# Patient Record
Sex: Female | Born: 1975 | Race: Black or African American | Hispanic: No | Marital: Married | Smoking: Never smoker
Health system: Southern US, Community
[De-identification: ages and names within clinical notes are randomized; demographics above are authoritative.]

## PROBLEM LIST (undated history)

## (undated) DIAGNOSIS — I10 Essential (primary) hypertension: Secondary | ICD-10-CM

## (undated) HISTORY — PX: ABDOMINAL HYSTERECTOMY: SHX81

---

## 2004-05-27 ENCOUNTER — Ambulatory Visit: Payer: Self-pay | Admitting: Family Medicine

## 2004-05-31 ENCOUNTER — Ambulatory Visit (HOSPITAL_COMMUNITY): Admission: RE | Admit: 2004-05-31 | Discharge: 2004-05-31 | Payer: Self-pay | Admitting: Family Medicine

## 2004-06-03 ENCOUNTER — Ambulatory Visit: Payer: Self-pay | Admitting: Family Medicine

## 2005-11-10 IMAGING — US US PELVIS COMPLETE MODIFY
1 series · 14 of 25 positions shown · non-contrast
Comparison: none

CLINICAL DATA: Dysmenorrhea.
 PELVIC ULTRASOUND COMPLETE MODIFIED AND TRANSVAGINAL NON-OBSTETRICAL ULTRASOUND OF THE PELVIS:
 Multiple scans of the pelvis show the uterus to be slightly enlarged at 5.0 x 5.0 x 10.2 cm.  The endometrium is slightly cystic and in the mid endometrial area shows a possible very small polyp.  The endometrium measures 13 mm in maximum thickness.  
 The right ovary is normal and measures 3.5 x 1.9 x 4.3 cm and the left ovary is normal and measures 4.2 x 2.5 x 4.0 cm.  There is a small amount of free fluid within the pelvis.

[Series 1: unknown · 0.32mm/px · 14 of 49 slices shown]
[im 1/49]
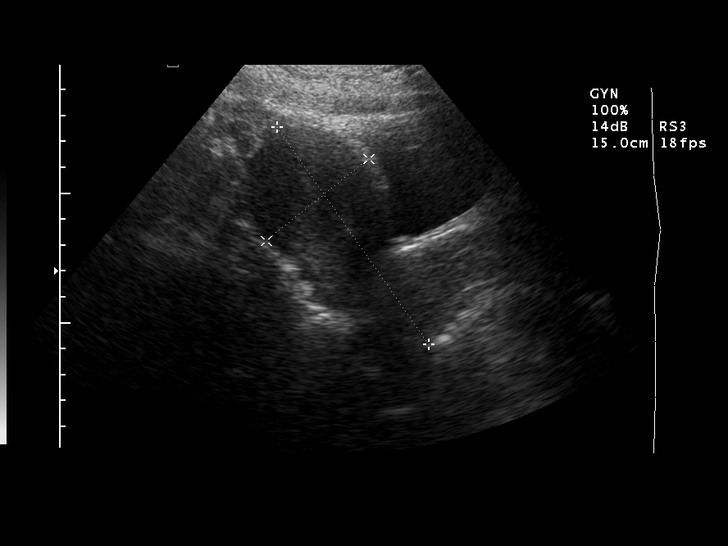
[im 5/49]
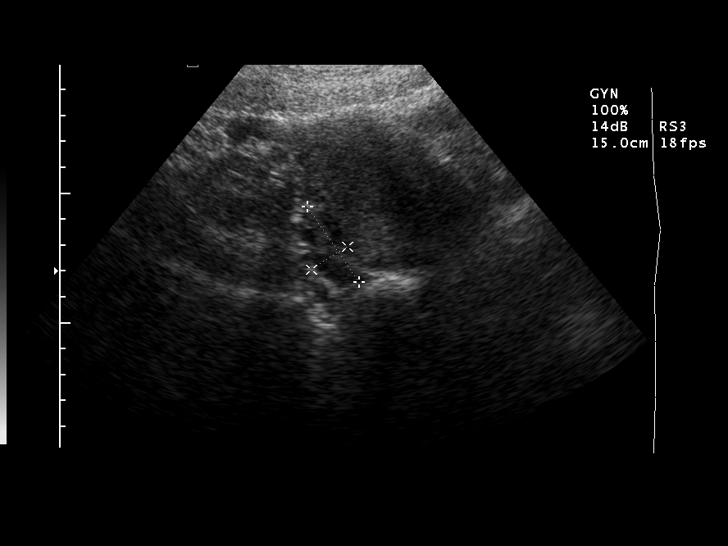
[im 9/49]
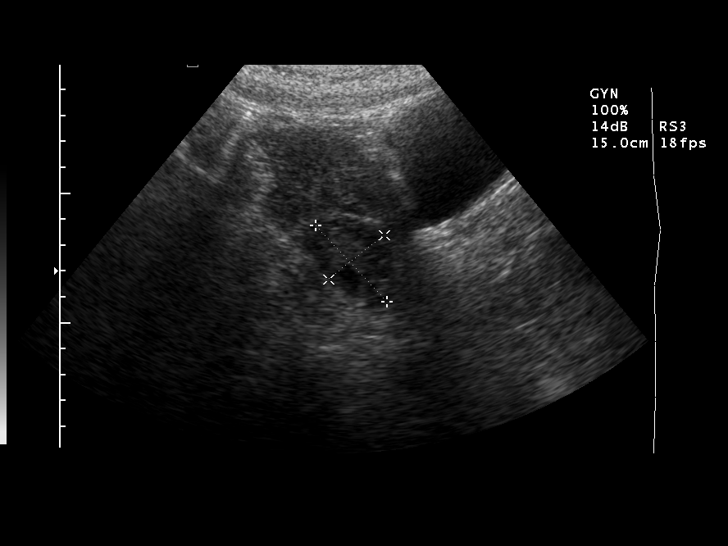
[im 13/49]
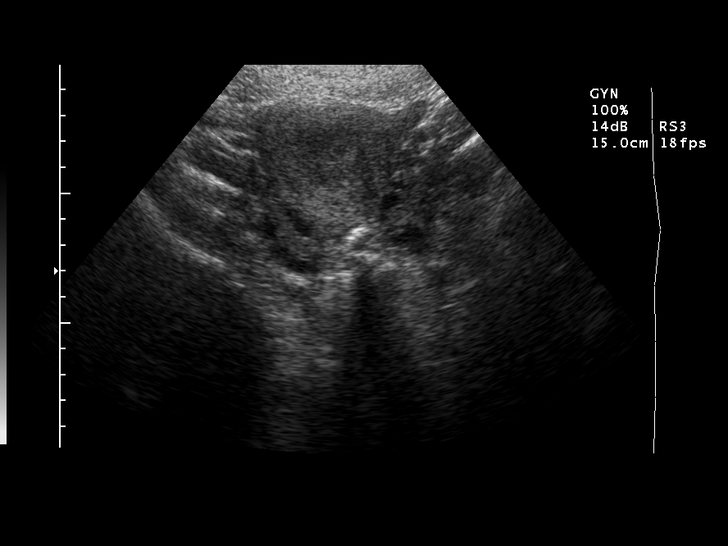
[im 17/49]
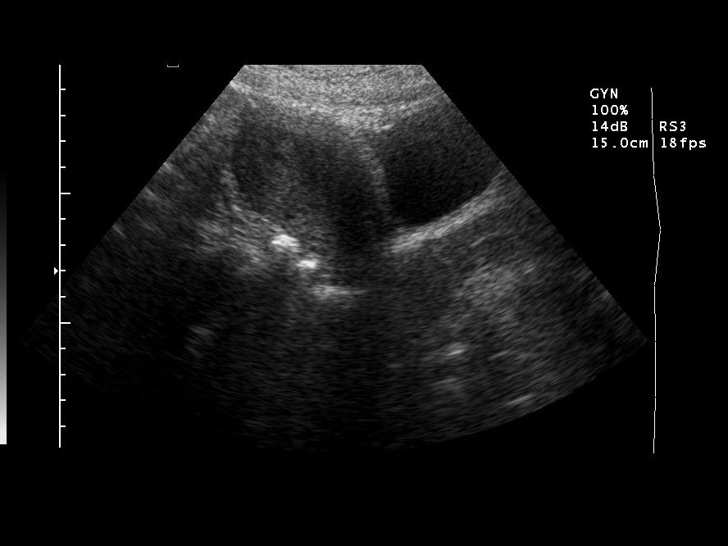
[im 19/49]
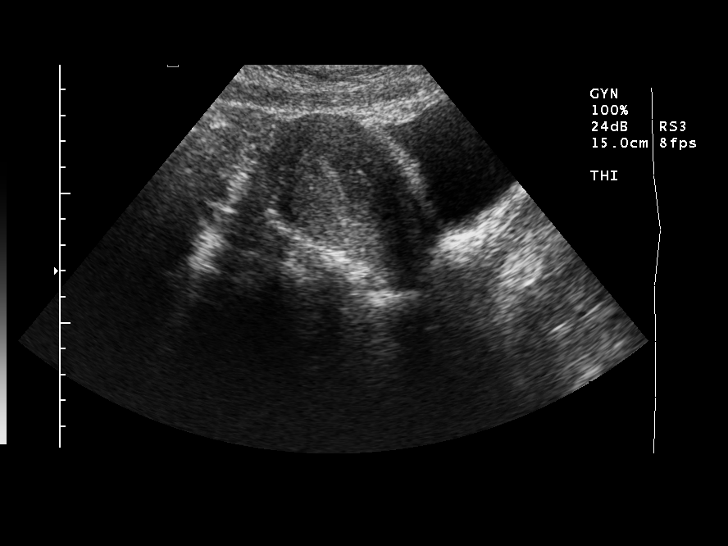
[im 23/49]
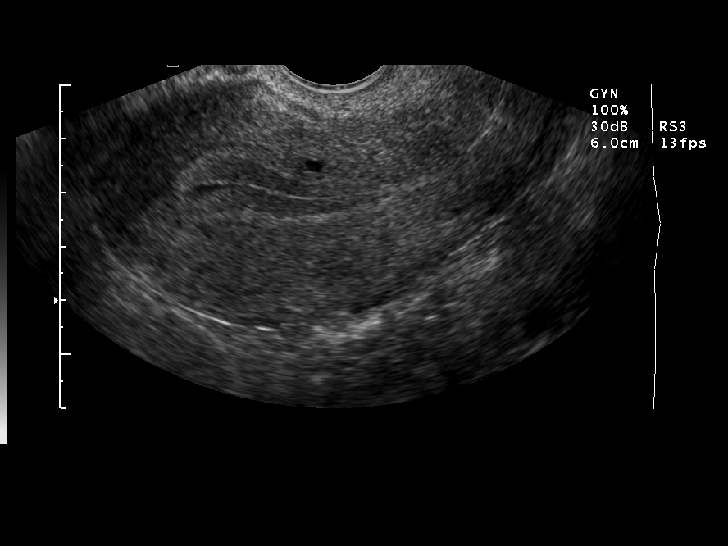
[im 27/49]
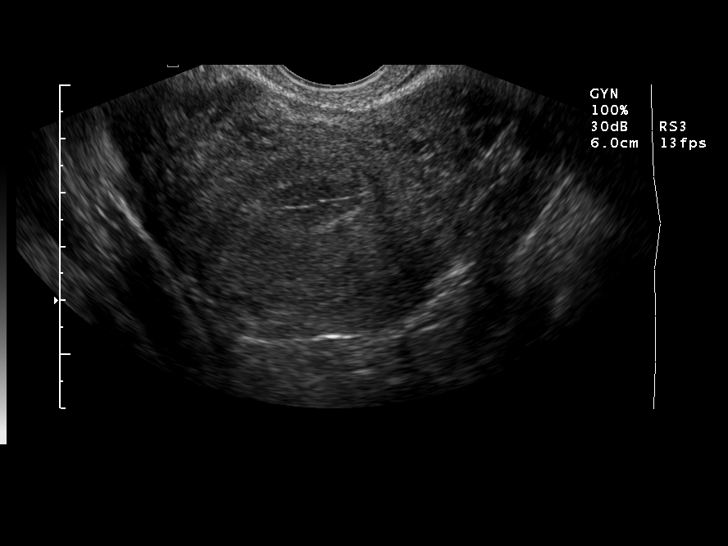
[im 31/49]
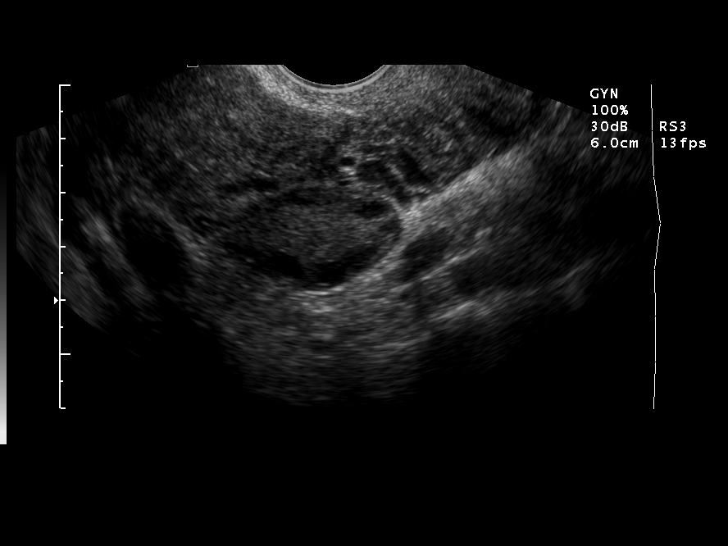
[im 33/49]
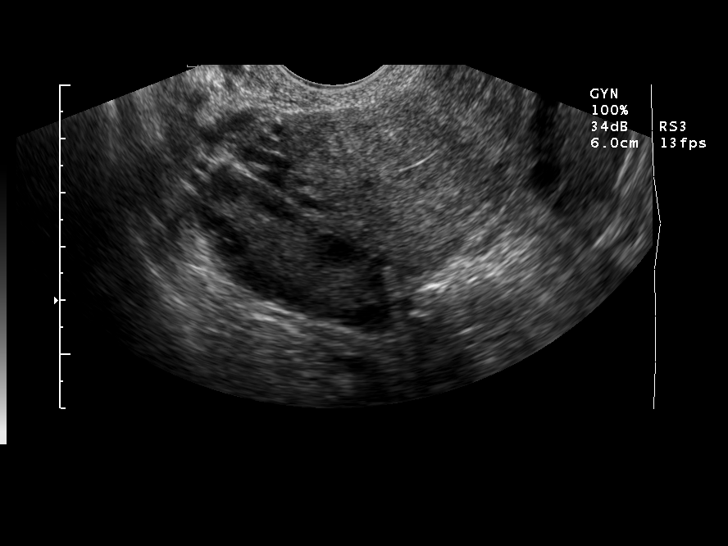
[im 37/49]
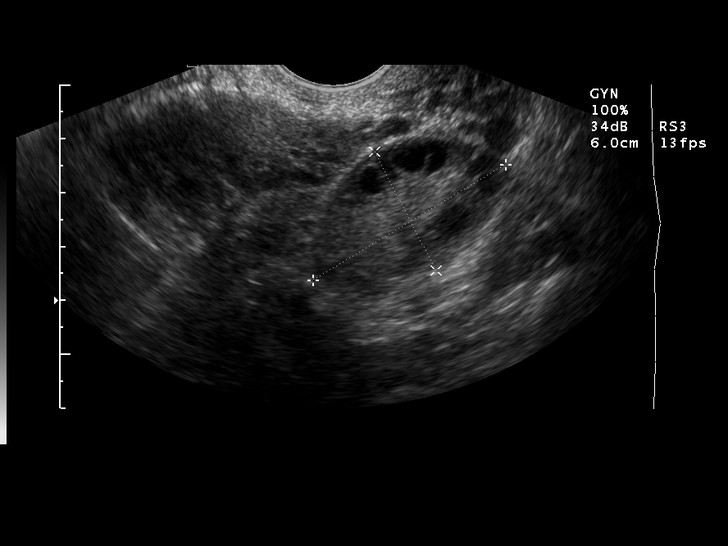
[im 41/49]
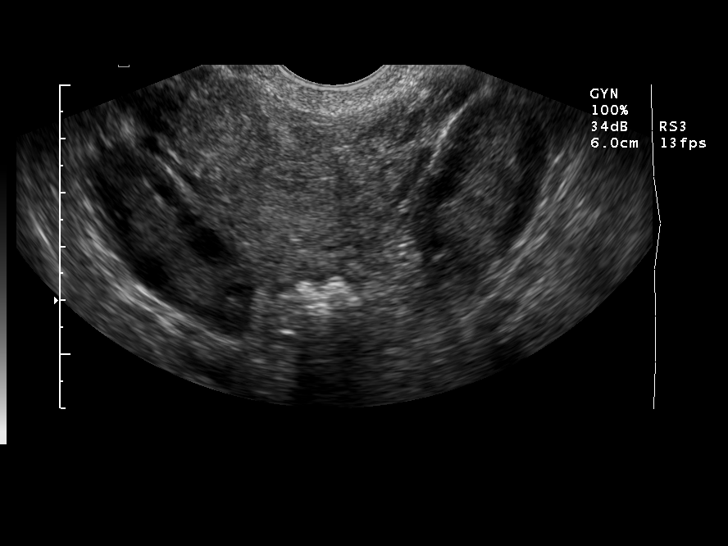
[im 45/49]
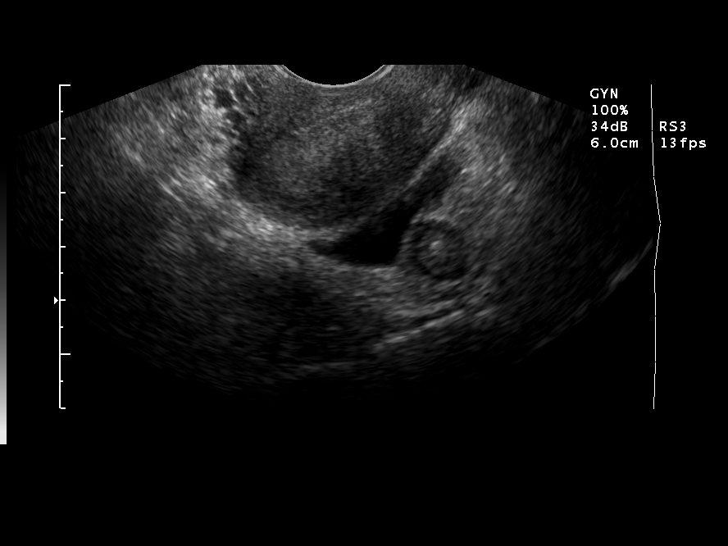
[im 49/49]
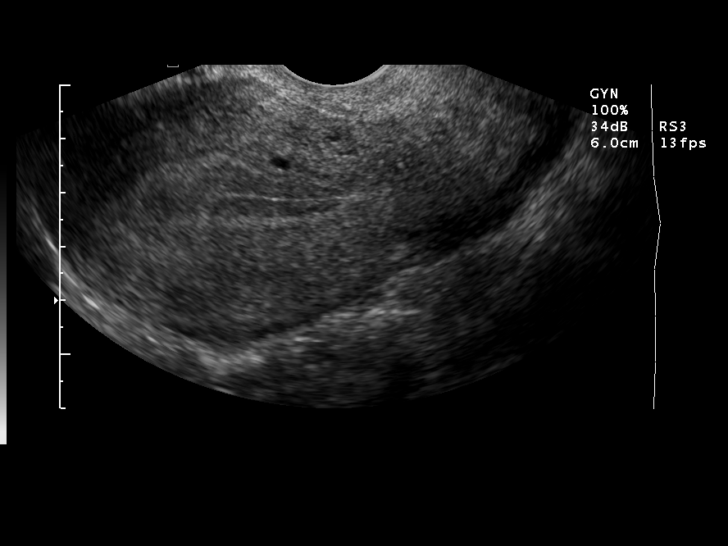

[14 of 25 positions shown; findings below may reference images not displayed]

IMPRESSION: The uterus is slightly prominent in size but shows no focal masses.  The endometrium is somewhat prominent at 13 mm and shows a cystic area in the mid endometrial region.  I cannot rule out a small polyp in that area.  The ovaries are normal.  There is a small amount of free fluid in the pelvis.

## 2011-12-11 ENCOUNTER — Ambulatory Visit: Payer: Self-pay | Attending: Family Medicine | Admitting: Physical Therapy

## 2011-12-11 DIAGNOSIS — R5381 Other malaise: Secondary | ICD-10-CM | POA: Insufficient documentation

## 2011-12-11 DIAGNOSIS — M542 Cervicalgia: Secondary | ICD-10-CM | POA: Insufficient documentation

## 2011-12-11 DIAGNOSIS — IMO0001 Reserved for inherently not codable concepts without codable children: Secondary | ICD-10-CM | POA: Insufficient documentation

## 2011-12-15 ENCOUNTER — Ambulatory Visit: Payer: Self-pay | Admitting: Physical Therapy

## 2011-12-18 ENCOUNTER — Ambulatory Visit: Payer: Self-pay | Admitting: Physical Therapy

## 2011-12-19 ENCOUNTER — Encounter: Payer: Self-pay | Admitting: Physical Therapy

## 2011-12-22 ENCOUNTER — Ambulatory Visit: Payer: Self-pay | Admitting: Physical Therapy

## 2011-12-25 ENCOUNTER — Encounter: Payer: Self-pay | Admitting: Physical Therapy

## 2011-12-30 ENCOUNTER — Ambulatory Visit: Payer: Self-pay | Admitting: Physical Therapy

## 2012-01-08 ENCOUNTER — Ambulatory Visit: Payer: Self-pay | Admitting: Physical Therapy

## 2018-11-19 ENCOUNTER — Other Ambulatory Visit: Payer: Self-pay

## 2018-11-19 ENCOUNTER — Emergency Department (INDEPENDENT_AMBULATORY_CARE_PROVIDER_SITE_OTHER): Payer: Self-pay

## 2018-11-19 ENCOUNTER — Emergency Department (INDEPENDENT_AMBULATORY_CARE_PROVIDER_SITE_OTHER)
Admission: EM | Admit: 2018-11-19 | Discharge: 2018-11-19 | Disposition: A | Discharge status: Home / Self Care | Payer: Self-pay | Source: Home / Self Care | Attending: Family Medicine | Admitting: Family Medicine

## 2018-11-19 DIAGNOSIS — S43402A Unspecified sprain of left shoulder joint, initial encounter: Secondary | ICD-10-CM

## 2018-11-19 DIAGNOSIS — M25512 Pain in left shoulder: Secondary | ICD-10-CM

## 2018-11-19 HISTORY — DX: Essential (primary) hypertension: I10
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
## 2018-11-19 NOTE — Discharge Instructions (Addendum)
Apply ice pack for 20 to 30 minutes, 3 to 4 times daily  Continue until pain and swelling decrease.  May take Ibuprofen 200mg , 4 tabs every 8 hours with food as needed for pain. Begin range of motion and stretching exercises as tolerated.

## 2019-04-10 ENCOUNTER — Emergency Department (INDEPENDENT_AMBULATORY_CARE_PROVIDER_SITE_OTHER)
Admission: EM | Admit: 2019-04-10 | Discharge: 2019-04-10 | Disposition: A | Discharge status: Home / Self Care | Payer: BC Managed Care – PPO | Source: Home / Self Care | Attending: Family Medicine | Admitting: Family Medicine

## 2019-04-10 ENCOUNTER — Encounter: Payer: Self-pay | Admitting: Emergency Medicine

## 2019-04-10 ENCOUNTER — Other Ambulatory Visit: Payer: Self-pay

## 2019-04-10 DIAGNOSIS — J069 Acute upper respiratory infection, unspecified: Secondary | ICD-10-CM | POA: Diagnosis not present

## 2019-04-10 DIAGNOSIS — J0101 Acute recurrent maxillary sinusitis: Secondary | ICD-10-CM

## 2019-04-10 MED ORDER — METHYLPREDNISOLONE ACETATE 80 MG/ML IJ SUSP
80.0000 mg | Freq: Once | INTRAMUSCULAR | Status: AC
  Administered 2019-04-10: 16:00:00 80 mg via INTRAMUSCULAR

## 2019-04-10 MED ORDER — AZITHROMYCIN 250 MG PO TABS: ORAL_TABLET | ORAL | 0 refills | Status: DC

## 2019-04-10 NOTE — ED Provider Notes (Signed)
[name]    CSN: [id number] Arrival date & time: 04/10/19  1359      History   Chief Complaint Chief Complaint  Patient presents with  . Nasal Congestion    HPI Marissa Sweeney is a 44 y.o. female.   Patient complains of four day history of mild sore throat, sinus congestion, fatigue, facial pressure, and mild cough.  She denies fever. She has a history of perennial rhinitis and recurring sinusitis.  The history is provided by the patient.    Past Medical History:  Diagnosis Date  . Hypertension     There are no problems to display for this patient.   Past Surgical History:  Procedure Laterality Date  . ABDOMINAL HYSTERECTOMY      OB History   No obstetric history on file.      Home Medications    Prior to Admission medications   Medication Sig Start Date End Date Taking? Authorizing Provider  cetirizine (ZYRTEC) 10 MG tablet Take 10 mg by mouth daily.   Yes [provider]  azithromycin (ZITHROMAX Z-PAK) 250 MG tablet Take 2 tabs today; then begin one tab once daily for 4 more days. 04/10/19   Kandra Nicolas, MD  Olmesartan-amLODIPine-HCTZ 40-10-25 MG TABS Take 1 tablet by mouth once daily 07/25/18   [provider]    Family History No family history on file.  Social History Social History   Tobacco Use  . Smoking status: Never Smoker  . Smokeless tobacco: Never Used  Substance Use Topics  . Alcohol use: Yes    Comment: rarely  . Drug use: Not Currently     Allergies   Patient has no known allergies.   Review of Systems Review of Systems ? sore throat + cough No pleuritic pain No wheezing + nasal congestion + post-nasal drainage + sinus pain/pressure No itchy/red eyes No earache No hemoptysis No SOB No fever/chills No nausea No vomiting No abdominal pain No diarrhea No urinary symptoms No skin rash + fatigue No myalgias No headache   Physical Exam Triage Vital Signs ED Triage  Vitals  Enc Vitals Group     BP 04/10/19 1451 138/79     Pulse Rate 04/10/19 1451 78     Resp 04/10/19 1451 16     Temp 04/10/19 1451 98.6 F (37 C)     Temp Source 04/10/19 1451 Oral     SpO2 04/10/19 1451 97 %     Weight 04/10/19 1452 188 lb (85.3 kg)     Height 04/10/19 1452 5\' 2"  (1.575 m)     Head Circumference --      Peak Flow --      Pain Score 04/10/19 1452 1     Pain Loc --      Pain Edu? --      Excl. in [place]? --    No data found.  Updated Vital Signs BP 138/79 (BP Location: Right Arm)   Pulse 78   Temp 98.6 F (37 C) (Oral)   Resp 16   Ht 5\' 2"  (1.575 m)   Wt 85.3 kg   SpO2 97%   BMI 34.39 kg/m   Visual Acuity Right Eye Distance:   Left Eye Distance:   Bilateral Distance:    Right Eye Near:   Left Eye Near:    Bilateral Near:     Physical Exam Nursing notes and Vital Signs reviewed. Appearance:  Patient appears stated age, and in no acute distress  Eyes:  Pupils are equal, round, and reactive to light and accomodation.  Extraocular movement is intact.  Conjunctivae are not inflamed  Ears:  Canals normal.  Tympanic membranes normal.  Nose:  Congested turbinates.  Maxillary sinus tenderness is present.  Pharynx:  Normal Neck:  Supple. No adenopathy. Lungs:  Clear to auscultation.  Breath sounds are equal.  Moving air well. Heart:  Regular rate and rhythm without murmurs, rubs, or gallops.  Abdomen:  Nontender without masses or hepatosplenomegaly.  Bowel sounds are present.  No CVA or flank tenderness.  Extremities:  No edema.  Skin:  No rash present.   UC Treatments / Results  Labs (all labs ordered are listed, but only abnormal results are displayed) Labs Reviewed - No data to display  EKG   Radiology No results found.  Procedures Procedures (including critical care time)  Medications Ordered in UC Medications  methylPREDNISolone acetate (DEPO-MEDROL) injection 80 mg (has no administration in time range)    Initial Impression /  Assessment and Plan / UC Course  I have reviewed the triage vital signs and the nursing notes.  Pertinent labs & imaging results that were available during my care of the patient were reviewed by me and considered in my medical decision making (see chart for details).    Administered Depo Medrol 80mg  IM. Begin Z-pak. Followup with Family Doctor if not improved in about 10 days.   Final Clinical Impressions(s) / UC Diagnoses   Final diagnoses:  Viral URI with cough  Acute recurrent maxillary sinusitis     Discharge Instructions     Take plain guaifenesin (1200mg  extended release tabs such as Mucinex) twice daily, with plenty of water, for cough and congestion.  Get adequate rest.   May use Afrin nasal spray (or generic oxymetazoline) each morning for about 5 days and then discontinue.  Also recommend using saline nasal spray several times daily and saline nasal irrigation (AYR is a common brand).  Use Flonase nasal spray each morning after using Afrin nasal spray and saline nasal irrigation. Try warm salt water gargles for sore throat.  Stop all antihistamines (cetirizine, etc) for now, and other non-prescription cough/cold preparations. May take Delsym Cough Suppressant at bedtime for nighttime cough.     ED Prescriptions    Medication Sig Dispense Auth. Provider   azithromycin (ZITHROMAX Z-PAK) 250 MG tablet Take 2 tabs today; then begin one tab once daily for 4 more days. 6 tablet , MD        , MD 04/12/19 [phone]

## 2019-04-10 NOTE — Discharge Instructions (Addendum)
Take plain guaifenesin (1200mg  extended release tabs such as Mucinex) twice daily, with plenty of water, for cough and congestion.  Get adequate rest.   May use Afrin nasal spray (or generic oxymetazoline) each morning for about 5 days and then discontinue.  Also recommend using saline nasal spray several times daily and saline nasal irrigation (AYR is a common brand).  Use Flonase nasal spray each morning after using Afrin nasal spray and saline nasal irrigation. Try warm salt water gargles for sore throat.  Stop all antihistamines (cetirizine, etc) for now, and other non-prescription cough/cold preparations. May take Delsym Cough Suppressant at bedtime for nighttime cough.

## 2019-04-10 NOTE — ED Triage Notes (Signed)
Patient believes she has her annual sinus infection; congested for past 4-5 days; no fever or cough; no exposure to covid positive person. She usually gets steroid injection and does well resolving. Has had influenza vacc this season.

## 2019-09-18 ENCOUNTER — Other Ambulatory Visit: Payer: Self-pay

## 2019-09-18 ENCOUNTER — Emergency Department (INDEPENDENT_AMBULATORY_CARE_PROVIDER_SITE_OTHER)
Admission: RE | Admit: 2019-09-18 | Discharge: 2019-09-18 | Disposition: A | Discharge status: Home / Self Care | Payer: Managed Care, Other (non HMO) | Source: Ambulatory Visit

## 2019-09-18 VITALS — BP 124/83 | HR 98 | Temp 99.1°F | Resp 15

## 2019-09-18 DIAGNOSIS — K122 Cellulitis and abscess of mouth: Secondary | ICD-10-CM

## 2019-09-18 DIAGNOSIS — J039 Acute tonsillitis, unspecified: Secondary | ICD-10-CM

## 2019-09-18 LAB — POCT RAPID STREP A (OFFICE): Rapid Strep A Screen: NEGATIVE

## 2019-09-18 MED ORDER — METHYLPREDNISOLONE SODIUM SUCC 40 MG IJ SOLR
80.0000 mg | Freq: Once | INTRAMUSCULAR | Status: AC
  Administered 2019-09-18: 80 mg via INTRAMUSCULAR

## 2019-09-18 MED ORDER — PREDNISONE 50 MG PO TABS: 50.0000 mg | ORAL_TABLET | Freq: Every day | ORAL | 0 refills | Status: AC

## 2019-09-18 NOTE — Discharge Instructions (Signed)
  You were given a shot of solumedrol (a steroid) today to help with inflammation and pain in your uvula and tonsils.  You have been prescribed 5 days of prednisone, an oral steroid.  You may start this medication tomorrow with breakfast.    You may take 500mg  acetaminophen every 4-6 hours or in combination with ibuprofen 400-600mg  every 6-8 hours as needed for pain, inflammation, and fever.  Be sure to well hydrated with clear liquids and get at least 8 hours of sleep at night, preferably more while sick.   Please follow up with family medicine later this week as well as Ear Nose and Throat specialist to discuss further treatment, especially if not improving in 3-4 days.

## 2019-09-18 NOTE — ED Provider Notes (Signed)
[facility]    CSN: [id number] Arrival date & time: 09/18/19  0853      History   Chief Complaint Chief Complaint  Patient presents with  . Appointment  . Sore Throat    HPI Marissa Sweeney is a 44 y.o. female.   HPI Marissa Sweeney is a 44 y.o. female presenting to UC with c/o sudden onset sore throat with swelling of her tonsils. Throat pain is 9/10, she is afraid to swallow due to the swelling and sensation of her tonsils touching. She was told a few years ago she should have her tonsils removed and has been told she has large tonsils even when not sick.  Denies fever, chills, cough, congestion, n/v/d. No sick contacts or recent travel.    Past Medical History:  Diagnosis Date  . Hypertension     There are no problems to display for this patient.   Past Surgical History:  Procedure Laterality Date  . ABDOMINAL HYSTERECTOMY      OB History   No obstetric history on file.      Home Medications    Prior to Admission medications   Medication Sig Start Date End Date Taking? Authorizing Provider  cetirizine (ZYRTEC) 10 MG tablet Take 10 mg by mouth daily.   Yes [provider]  liraglutide (VICTOZA) 18 MG/3ML SOPN Inject into the skin. 09/09/19  Yes [provider]  Olmesartan-amLODIPine-HCTZ 40-10-25 MG TABS Take 1 tablet by mouth once daily 07/25/18  Yes [provider]  QSYMIA 7.5-46 MG CP24 Take 1 capsule by mouth daily. 05/23/19  Yes [provider]  valACYclovir (VALTREX) 500 MG tablet Take 1 tablet by mouth 2 (two) times daily. 08/23/19  Yes [provider]  Vitamin D, Ergocalciferol, (DRISDOL) 1.25 MG (50000 UNIT) CAPS capsule Take 50,000 Units by mouth 2 (two) times a week. 09/05/19  Yes [provider]  zolpidem (AMBIEN) 10 MG tablet Take by mouth. 05/31/19  Yes [provider]  azithromycin (ZITHROMAX Z-PAK) 250 MG tablet Take 2 tabs today; then begin one tab once daily for 4 more  days. 04/10/19   Lattie Haw, MD  predniSONE (DELTASONE) 50 MG tablet Take 1 tablet (50 mg total) by mouth daily with breakfast for 5 days. 09/18/19 09/23/19  Lurene Shadow, PA-C    Family History Family History  Problem Relation Age of Onset  . Hypertension Mother   . Diabetes Father   . Hypertension Father   . Healthy Brother     Social History Social History   Tobacco Use  . Smoking status: Never Smoker  . Smokeless tobacco: Never Used  Vaping Use  . Vaping Use: Never used  Substance Use Topics  . Alcohol use: Yes    Comment: rarely  . Drug use: Not Currently     Allergies   Patient has no known allergies.   Review of Systems Review of Systems  Constitutional: Negative for chills and fever.  HENT: Positive for sore throat and trouble swallowing. Negative for congestion, ear pain and voice change.   Respiratory: Negative for cough and shortness of breath.   Cardiovascular: Negative for chest pain and palpitations.  Gastrointestinal: Negative for abdominal pain, diarrhea, nausea and vomiting.  Musculoskeletal: Negative for arthralgias, back pain and myalgias.  Skin: Negative for rash.  All other systems reviewed and are negative.    Physical Exam Triage Vital Signs ED Triage Vitals  Enc Vitals Group     BP 09/18/19 0905 124/83  Pulse Rate 09/18/19 0905 98     Resp 09/18/19 0905 15     Temp 09/18/19 0905 99.1 F (37.3 C)     Temp Source 09/18/19 0905 Oral     SpO2 09/18/19 0905 98 %     Weight --      Height --      Head Circumference --      Peak Flow --      Pain Score 09/18/19 0909 9     Pain Loc --      Pain Edu? --      Excl. in [place]? --    No data found.  Updated Vital Signs BP 124/83 (BP Location: Right Arm)   Pulse 98   Temp 99.1 F (37.3 C) (Oral)   Resp 15   SpO2 98%   Visual Acuity Right Eye Distance:   Left Eye Distance:   Bilateral Distance:    Right Eye Near:   Left Eye Near:    Bilateral Near:     Physical  Exam Vitals and nursing note reviewed.  Constitutional:      General: She is not in acute distress.    Appearance: She is well-developed. She is not ill-appearing, toxic-appearing or diaphoretic.  HENT:     Head: Normocephalic and atraumatic.     Right Ear: Tympanic membrane and ear canal normal.     Left Ear: Tympanic membrane and ear canal normal.     Nose: Nose normal.     Right Sinus: No maxillary sinus tenderness or frontal sinus tenderness.     Left Sinus: No maxillary sinus tenderness or frontal sinus tenderness.     Mouth/Throat:     Lips: Pink.     Mouth: Mucous membranes are moist.     Pharynx: Oropharynx is clear. Uvula midline. Posterior oropharyngeal erythema and uvula swelling present. No pharyngeal swelling or oropharyngeal exudate.     Tonsils: 3+ on the right. 3+ on the left.  Cardiovascular:     Rate and Rhythm: Normal rate and regular rhythm.  Pulmonary:     Effort: Pulmonary effort is normal. No respiratory distress.     Breath sounds: Normal breath sounds. No stridor. No wheezing, rhonchi or rales.  Musculoskeletal:        General: Normal range of motion.     Cervical back: Normal range of motion.  Skin:    General: Skin is warm and dry.  Neurological:     Mental Status: She is alert and oriented to person, place, and time.  Psychiatric:        Behavior: Behavior normal.      UC Treatments / Results  Labs (all labs ordered are listed, but only abnormal results are displayed) Labs Reviewed  POCT RAPID STREP A (OFFICE)    EKG   Radiology No results found.  Procedures Procedures (including critical care time)  Medications Ordered in UC Medications  methylPREDNISolone sodium succinate (SOLU-MEDROL) 40 mg/mL injection 80 mg (80 mg Intramuscular Given 09/18/19 0927)    Initial Impression / Assessment and Plan / UC Course  I have reviewed the triage vital signs and the nursing notes.  Pertinent labs & imaging results that were available during  my care of the patient were reviewed by me and considered in my medical decision making (see chart for details).     Rapid strep performed in triage: negative  Hx and exam c/w uvulitis, viral illness Will tx with steroids Pt able to keep down a few  sips of water in UC No respiratory distress.  F/u with PCP and ENT this week Discussed symptoms that warrant emergent care in the ED. AVS given  Final Clinical Impressions(s) / UC Diagnoses   Final diagnoses:  Uvulitis  Acute tonsillitis, unspecified etiology     Discharge Instructions      You were given a shot of solumedrol (a steroid) today to help with inflammation and pain in your uvula and tonsils.  You have been prescribed 5 days of prednisone, an oral steroid.  You may start this medication tomorrow with breakfast.    You may take 500mg  acetaminophen every 4-6 hours or in combination with ibuprofen 400-600mg  every 6-8 hours as needed for pain, inflammation, and fever.  Be sure to well hydrated with clear liquids and get at least 8 hours of sleep at night, preferably more while sick.   Please follow up with family medicine later this week as well as Ear Nose and Throat specialist to discuss further treatment, especially if not improving in 3-4 days.       ED Prescriptions    Medication Sig Dispense Auth. Provider   predniSONE (DELTASONE) 50 MG tablet Take 1 tablet (50 mg total) by mouth daily with breakfast for 5 days. 5 tablet , PA-C     PDMP not reviewed this encounter.   Lurene Shadow, Lurene Shadow 09/18/19 [phone]

## 2019-09-18 NOTE — ED Triage Notes (Signed)
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
## 2022-12-18 ENCOUNTER — Ambulatory Visit
Admission: RE | Admit: 2022-12-18 | Discharge: 2022-12-18 | Disposition: A | Discharge status: Home / Self Care | Payer: Managed Care, Other (non HMO) | Source: Ambulatory Visit | Attending: Family Medicine | Admitting: Family Medicine

## 2022-12-18 ENCOUNTER — Other Ambulatory Visit: Payer: Self-pay

## 2022-12-18 VITALS — BP 134/84 | HR 78 | Temp 98.2°F | Resp 16

## 2022-12-18 DIAGNOSIS — M65931 Unspecified synovitis and tenosynovitis, right forearm: Secondary | ICD-10-CM | POA: Diagnosis not present

## 2022-12-18 DIAGNOSIS — M79601 Pain in right arm: Secondary | ICD-10-CM | POA: Diagnosis not present

## 2022-12-18 MED ORDER — PREDNISONE 10 MG (21) PO TBPK: ORAL_TABLET | Freq: Every day | ORAL | 0 refills | Status: AC

## 2022-12-18 MED ORDER — METHYLPREDNISOLONE SODIUM SUCC 125 MG IJ SOLR
125.0000 mg | Freq: Once | INTRAMUSCULAR | Status: AC
  Administered 2022-12-18: 125 mg via INTRAMUSCULAR

## 2022-12-18 NOTE — ED Triage Notes (Signed)
C/o right arm pain x 2 weeks. Cannot raise arm or bend it. Has carpal tunnel and reports it feels the same as this issue.

## 2022-12-18 NOTE — ED Provider Notes (Signed)
[facility]    CSN: [id number] Arrival date & time: 12/18/22  1651      History   Chief Complaint Chief Complaint  Patient presents with   Arm Injury    HPI Marissa Sweeney is a 47 y.o. female.   HPI 47 year old female presents with right arm pain for 2 weeks and believes this is related to her carpal tunnel of right hand/right wrist.  PMH significant for obesity and HTN.  Past Medical History:  Diagnosis Date   Hypertension     There are no problems to display for this patient.   Past Surgical History:  Procedure Laterality Date   ABDOMINAL HYSTERECTOMY      OB History   No obstetric history on file.      Home Medications    Prior to Admission medications   Medication Sig Start Date End Date Taking? Authorizing Provider  predniSONE (STERAPRED UNI-PAK 21 TAB) 10 MG (21) TBPK tablet Take by mouth daily. Take 6 tabs by mouth daily  for 2 days, then 5 tabs for 2 days, then 4 tabs for 2 days, then 3 tabs for 2 days, 2 tabs for 2 days, then 1 tab by mouth daily for 2 days 12/18/22  Yes Trevor Iha, FNP  cetirizine (ZYRTEC) 10 MG tablet Take 10 mg by mouth daily.    [provider]  Olmesartan-amLODIPine-HCTZ 40-10-25 MG TABS Take 1 tablet by mouth once daily 07/25/18   [provider]  valACYclovir (VALTREX) 500 MG tablet Take 1 tablet by mouth 2 (two) times daily. 08/23/19   [provider]  zolpidem (AMBIEN) 10 MG tablet Take by mouth. 05/31/19   [provider]    Family History Family History  Problem Relation Age of Onset   Hypertension Mother    Diabetes Father    Hypertension Father    Healthy Brother     Social History Social History   Tobacco Use   Smoking status: Never   Smokeless tobacco: Never  Vaping Use   Vaping status: Never Used  Substance Use Topics   Alcohol use: Yes    Comment: rarely   Drug use: Not Currently     Allergies   Patient has no known allergies.   Review of  Systems Review of Systems  Musculoskeletal:        Right wrist/right lower arm pain x 2 weeks patient believes this is related to her carpal tunnel syndrome of the right lower arm     Physical Exam Triage Vital Signs ED Triage Vitals  Encounter Vitals Group     BP      Systolic BP Percentile      Diastolic BP Percentile      Pulse      Resp      Temp      Temp src      SpO2      Weight      Height      Head Circumference      Peak Flow      Pain Score      Pain Loc      Pain Education      Exclude from Growth Chart    No data found.  Updated Vital Signs BP 134/84   Pulse 78   Temp 98.2 F (36.8 C)   Resp 16   SpO2 99%   Physical Exam Vitals and nursing note reviewed.  Constitutional:      Appearance: Normal appearance.  She is normal weight. She is not ill-appearing.  HENT:     Head: Normocephalic and atraumatic.     Mouth/Throat:     Mouth: Mucous membranes are moist.     Pharynx: Oropharynx is clear.  Eyes:     Extraocular Movements: Extraocular movements intact.     Conjunctiva/sclera: Conjunctivae normal.     Pupils: Pupils are equal, round, and reactive to light.  Cardiovascular:     Rate and Rhythm: Normal rate and regular rhythm.     Pulses: Normal pulses.     Heart sounds: Normal heart sounds. No murmur heard. Pulmonary:     Effort: Pulmonary effort is normal.     Breath sounds: Normal breath sounds. No wheezing, rhonchi or rales.  Musculoskeletal:        General: Normal range of motion.     Cervical back: Normal range of motion and neck supple.     Comments: Patient reporting radiating pain from dorsum of right wrist to right superior forearm, grip 3/5, neurovascular intact, neurosensory intact  Skin:    General: Skin is warm and dry.  Neurological:     General: No focal deficit present.     Mental Status: She is alert and oriented to person, place, and time. Mental status is at baseline.  Psychiatric:        Mood and Affect: Mood normal.         Behavior: Behavior normal.        Thought Content: Thought content normal.      UC Treatments / Results  Labs (all labs ordered are listed, but only abnormal results are displayed) Labs Reviewed - No data to display  EKG   Radiology No results found.  Procedures Procedures (including critical care time)  Medications Ordered in UC Medications  methylPREDNISolone sodium succinate (SOLU-MEDROL) 125 mg/2 mL injection 125 mg (125 mg Intramuscular Given 12/18/22 1727)    Initial Impression / Assessment and Plan / UC Course  I have reviewed the triage vital signs and the nursing notes.  Pertinent labs & imaging results that were available during my care of the patient were reviewed by me and considered in my medical decision making (see chart for details).     MDM: 1.  Tenosynovitis of right wrist-IM Solu-Medrol 125 mg given once in clinic and prior to discharge, right wrist brace applied to patient's right wrist prior to discharge with specific instructions provided to patient; 2.  Right arm pain-Rx'd Sterapred Unipak (tapering from 60 mg to 10 mg over 10 days). Advised patient to take medication as directed with food to completion.  Encouraged increase daily water intake to 64 ounces per day while taking this medication.  Encourage patient to wear right wrist brace 24/7 (except when bathing) for the next 2 weeks.  Advised if symptoms worsen and/or unresolved please follow-up with PCP, here, or [facility] for further evaluation. Final Clinical Impressions(s) / UC Diagnoses   Final diagnoses:  Tenosynovitis of right wrist  Right arm pain     Discharge Instructions      Advised patient to take medication as directed with food to completion.  Encouraged increase daily water intake to 64 ounces per day while taking this medication.  Encourage patient to wear right wrist brace 24/7 (except when bathing) for the next 2 weeks.  Advised if symptoms worsen and/or  unresolved please follow-up with PCP, here, or [facility] for further evaluation.     ED Prescriptions  Medication Sig Dispense Auth. Provider   predniSONE (STERAPRED UNI-PAK 21 TAB) 10 MG (21) TBPK tablet Take by mouth daily. Take 6 tabs by mouth daily  for 2 days, then 5 tabs for 2 days, then 4 tabs for 2 days, then 3 tabs for 2 days, 2 tabs for 2 days, then 1 tab by mouth daily for 2 days 42 tablet Trevor Iha, FNP      PDMP not reviewed this encounter.   Trevor Iha, FNP 12/18/22 1742

## 2022-12-18 NOTE — Discharge Instructions (Addendum)
Advised patient to take medication as directed with food to completion.  Encouraged increase daily water intake to 64 ounces per day while taking this medication.  Encourage patient to wear right wrist brace 24/7 (except when bathing) for the next 2 weeks.  Advised if symptoms worsen and/or unresolved please follow-up with PCP, here, or [facility] for further evaluation.

## 2023-01-19 ENCOUNTER — Encounter: Payer: Managed Care, Other (non HMO) | Admitting: Nurse Practitioner
